# Patient Record
Sex: Male | Born: 1995 | Race: White | Hispanic: No | Marital: Single
Health system: Southern US, Community
[De-identification: ages and names within clinical notes are randomized; demographics above are authoritative.]

---
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
## 2019-03-03 ENCOUNTER — Other Ambulatory Visit: Payer: Self-pay

## 2019-03-03 ENCOUNTER — Emergency Department (HOSPITAL_COMMUNITY)
Admission: EM | Admit: 2019-03-03 | Discharge: 2019-03-03 | Disposition: A | Discharge status: Home / Self Care | Payer: Self-pay | Attending: Emergency Medicine | Admitting: Emergency Medicine

## 2019-03-03 ENCOUNTER — Emergency Department (HOSPITAL_COMMUNITY): Payer: Self-pay

## 2019-03-03 DIAGNOSIS — Y9389 Activity, other specified: Secondary | ICD-10-CM | POA: Insufficient documentation

## 2019-03-03 DIAGNOSIS — Y929 Unspecified place or not applicable: Secondary | ICD-10-CM | POA: Insufficient documentation

## 2019-03-03 DIAGNOSIS — W231XXA Caught, crushed, jammed, or pinched between stationary objects, initial encounter: Secondary | ICD-10-CM | POA: Insufficient documentation

## 2019-03-03 DIAGNOSIS — S61111A Laceration without foreign body of right thumb with damage to nail, initial encounter: Secondary | ICD-10-CM | POA: Insufficient documentation

## 2019-03-03 DIAGNOSIS — Y998 Other external cause status: Secondary | ICD-10-CM | POA: Insufficient documentation

## 2019-03-03 NOTE — ED Notes (Signed)
Dermabond to bedside.

## 2019-03-03 NOTE — ED Notes (Signed)
ED Provider at bedside. 
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
## 2019-03-03 NOTE — Discharge Instructions (Signed)
Keep your wound clean with mild soap and warm water.  Do not apply alcohol or peroxide as this will breakdown newly forming skin.  Keep a bandage over your wound for at least a week.  Your broken nail will eventually fall off, but try to keep this in place as long as possible.  If you find that your tetanus was last updated more than 5 years ago, return for tetanus booster.  Take tylenol or ibuprofen for management of any pain or soreness.

## 2019-03-03 NOTE — ED Notes (Signed)
Pt finger wrapped with non stick dressing and coban at this time
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
## 2019-03-15 ENCOUNTER — Ambulatory Visit: Payer: HRSA Program | Attending: Internal Medicine

## 2019-03-15 DIAGNOSIS — Z20828 Contact with and (suspected) exposure to other viral communicable diseases: Secondary | ICD-10-CM | POA: Diagnosis not present

## 2019-03-15 DIAGNOSIS — Z20822 Contact with and (suspected) exposure to covid-19: Secondary | ICD-10-CM

## 2019-03-16 LAB — NOVEL CORONAVIRUS, NAA: SARS-CoV-2, NAA: NOT DETECTED

## 2020-09-16 IMAGING — DX DG FINGER THUMB 2+V*R*
3 series · 3 of 3 positions shown · non-contrast
Comparison: None.

CLINICAL DATA: Pain

EXAM:
RIGHT THUMB 2+V

[finger ap]
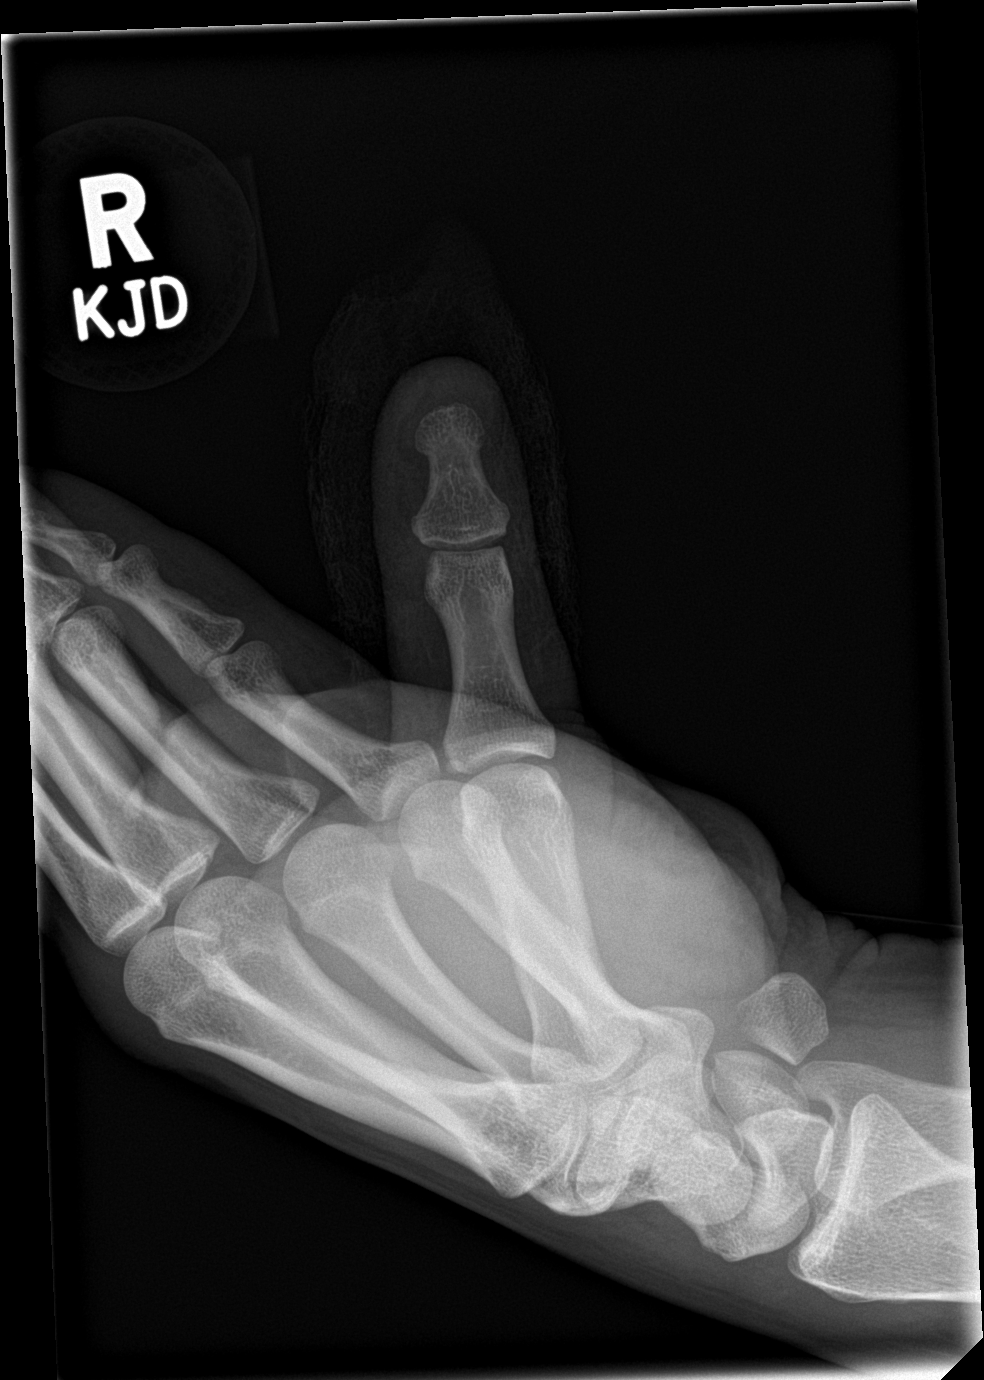

[finger obl]
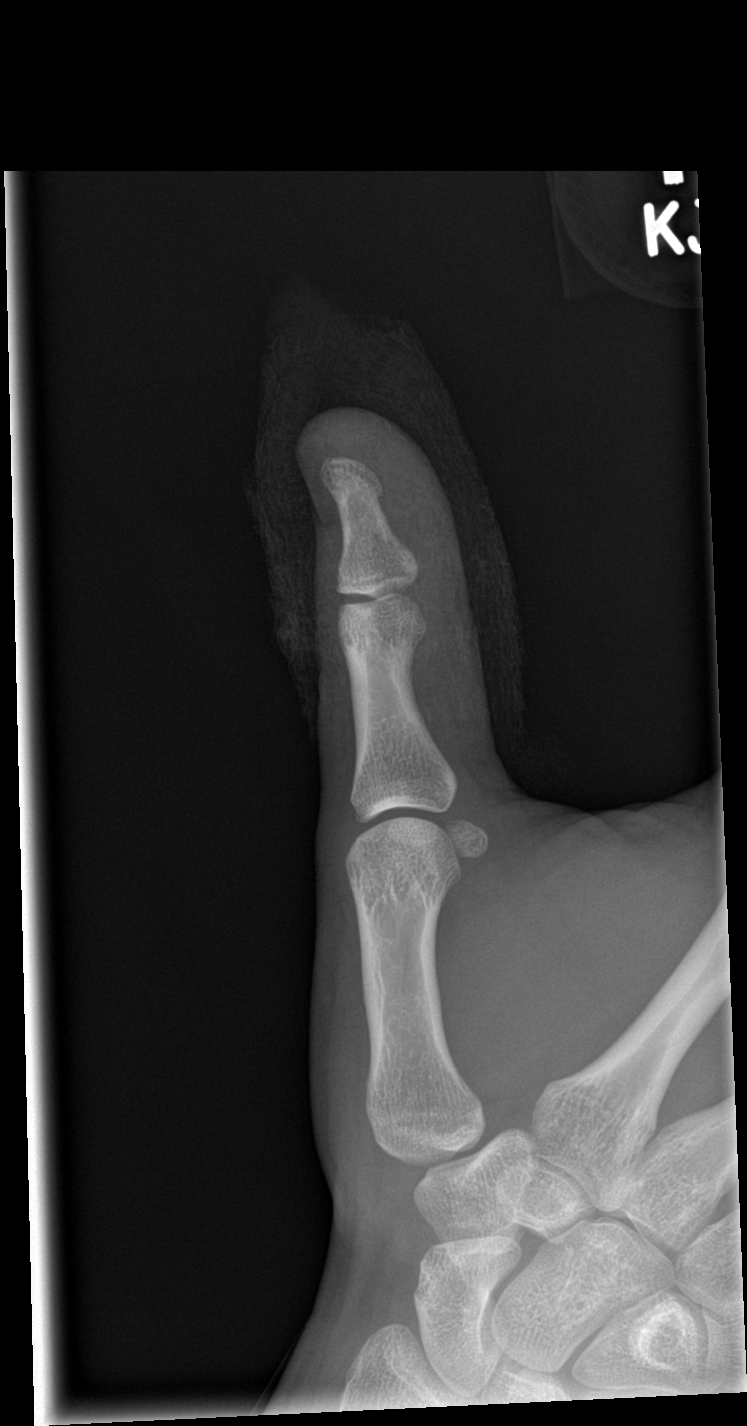

[finger lat]
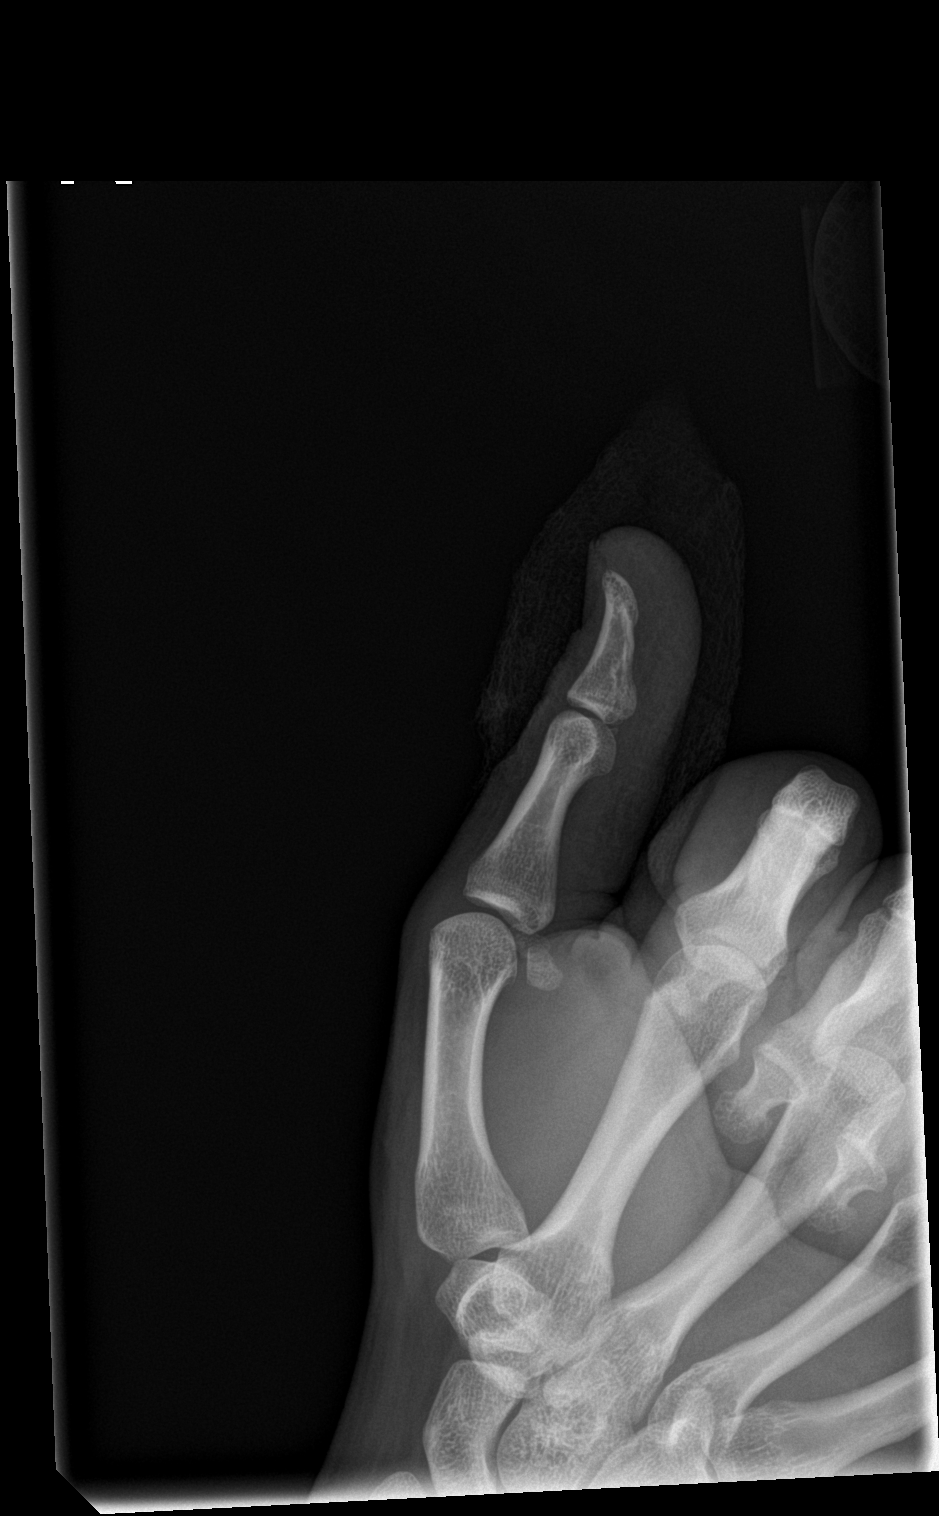

[3 of 3 positions shown; findings below may reference images not displayed]

FINDINGS: There is no acute displaced fracture or dislocation. There is soft
tissue swelling. There is no radiopaque foreign body.
IMPRESSION: Soft tissue swelling without acute fracture or dislocation. No
radiopaque foreign body.
